# Patient Record
Sex: Male | Born: 1985 | Race: White | Hispanic: No | Marital: Single | Smoking: Never smoker
Health system: Southern US, Community
[De-identification: ages and names within clinical notes are randomized; demographics above are authoritative.]

## PROBLEM LIST (undated history)

## (undated) HISTORY — PX: FOOT SURGERY: SHX648

## (undated) HISTORY — PX: OTHER SURGICAL HISTORY: SHX169

## (undated) HISTORY — PX: SHOULDER SURGERY: SHX246

---

## 2008-03-04 ENCOUNTER — Emergency Department (HOSPITAL_COMMUNITY): Admission: EM | Admit: 2008-03-04 | Discharge: 2008-03-04 | Payer: Self-pay | Admitting: Emergency Medicine

## 2008-05-31 ENCOUNTER — Emergency Department (HOSPITAL_BASED_OUTPATIENT_CLINIC_OR_DEPARTMENT_OTHER): Admission: EM | Admit: 2008-05-31 | Discharge: 2008-05-31 | Payer: Self-pay | Admitting: Emergency Medicine

## 2008-05-31 ENCOUNTER — Ambulatory Visit: Payer: Self-pay | Admitting: Diagnostic Radiology

## 2008-06-05 ENCOUNTER — Emergency Department (HOSPITAL_BASED_OUTPATIENT_CLINIC_OR_DEPARTMENT_OTHER): Admission: EM | Admit: 2008-06-05 | Discharge: 2008-06-06 | Payer: Self-pay | Admitting: Emergency Medicine

## 2008-06-05 ENCOUNTER — Ambulatory Visit: Payer: Self-pay | Admitting: Diagnostic Radiology

## 2008-06-06 ENCOUNTER — Ambulatory Visit: Payer: Self-pay | Admitting: Diagnostic Radiology

## 2009-10-11 ENCOUNTER — Ambulatory Visit (HOSPITAL_BASED_OUTPATIENT_CLINIC_OR_DEPARTMENT_OTHER): Admission: RE | Admit: 2009-10-11 | Discharge: 2009-10-11 | Payer: Self-pay | Admitting: Unknown Physician Specialty

## 2010-09-03 LAB — POCT HEMOGLOBIN-HEMACUE: Hemoglobin: 14.4 g/dL (ref 13.0–17.0)

## 2011-03-17 LAB — POCT I-STAT, CHEM 8
Calcium, Ion: <0.25 — ABNORMAL LOW
Creatinine, Ser: 0.9
Glucose, Bld: 91
HCT: 31 — ABNORMAL LOW
Hemoglobin: 10.5 — ABNORMAL LOW

## 2013-03-23 ENCOUNTER — Encounter: Payer: Self-pay | Admitting: Podiatry

## 2013-04-06 ENCOUNTER — Ambulatory Visit: Payer: BC Managed Care – PPO | Admitting: Podiatry

## 2013-05-04 ENCOUNTER — Ambulatory Visit: Payer: BC Managed Care – PPO | Admitting: Podiatry

## 2013-07-21 ENCOUNTER — Ambulatory Visit: Payer: BC Managed Care – PPO | Admitting: Podiatry

## 2013-07-28 ENCOUNTER — Ambulatory Visit: Payer: BC Managed Care – PPO | Admitting: Podiatry

## 2013-08-11 ENCOUNTER — Ambulatory Visit: Payer: BC Managed Care – PPO | Admitting: Podiatry

## 2013-08-17 ENCOUNTER — Ambulatory Visit (INDEPENDENT_AMBULATORY_CARE_PROVIDER_SITE_OTHER): Payer: BC Managed Care – PPO

## 2013-08-17 ENCOUNTER — Encounter: Payer: Self-pay | Admitting: Podiatry

## 2013-08-17 ENCOUNTER — Ambulatory Visit (INDEPENDENT_AMBULATORY_CARE_PROVIDER_SITE_OTHER): Payer: BC Managed Care – PPO | Admitting: Podiatry

## 2013-08-17 VITALS — BP 154/72 | HR 62 | Resp 12

## 2013-08-17 DIAGNOSIS — R52 Pain, unspecified: Secondary | ICD-10-CM

## 2013-08-17 DIAGNOSIS — M7750 Other enthesopathy of unspecified foot: Secondary | ICD-10-CM

## 2013-08-17 MED ORDER — TRAMADOL HCL 50 MG PO TABS: 50.0000 mg | ORAL_TABLET | Freq: Three times a day (TID) | ORAL | Status: DC

## 2013-08-17 MED ORDER — TRIAMCINOLONE ACETONIDE 10 MG/ML IJ SUSP
10.0000 mg | Freq: Once | INTRAMUSCULAR | Status: AC
  Administered 2013-08-17: 10 mg

## 2013-08-18 NOTE — Progress Notes (Signed)
Subjective:     Patient ID: Robert Valenzuela, male   DOB: 07/25/1985, 28 y.o.   MRN: [id number]  HPI patient states that he is still getting some discomfort around the surgery site on his right foot. 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 Did careful injection more plantar 3 mg Kenalog 5 mm Xylocaine Marcaine mixture and I advised him on wearing his boot and his orthotics. He will also be placed on tramadol 50 mg 3 times a day and will be seen back in 4 weeks

## 2013-09-07 ENCOUNTER — Ambulatory Visit (INDEPENDENT_AMBULATORY_CARE_PROVIDER_SITE_OTHER): Payer: BC Managed Care – PPO | Admitting: Podiatry

## 2013-09-07 ENCOUNTER — Encounter: Payer: Self-pay | Admitting: Podiatry

## 2013-09-07 VITALS — BP 149/79 | HR 57 | Resp 16

## 2013-09-07 DIAGNOSIS — M7750 Other enthesopathy of unspecified foot: Secondary | ICD-10-CM

## 2013-09-07 NOTE — Progress Notes (Signed)
Subjective:     Patient ID: Robert Valenzuela, male   DOB: 06/18/1985, 28 y.o.   MRN: [id number]  HPI patient states that he is improved with the tramadol but he continues to feel lack of support right foot and minimal and moderate discomfort if he's on it too long   Review of Systems     Objective:   Physical Exam Neurovascular status was intact with no health history changes noted and mild discomfort posterior tibial insertion right with incision site healed well from previous surgery    Assessment:     Continue tendinitis right posterior tib insertion and slightly plantar    Plan:     New orthotics were scanned and a new fascially brace dispensed the patient. Reappoint in 2 weeks

## 2013-09-22 ENCOUNTER — Ambulatory Visit: Payer: BC Managed Care – PPO | Admitting: Podiatry

## 2013-10-06 ENCOUNTER — Encounter: Payer: Self-pay | Admitting: Podiatry

## 2016-02-13 ENCOUNTER — Other Ambulatory Visit: Payer: Self-pay | Admitting: Otolaryngology

## 2016-02-13 DIAGNOSIS — H9121 Sudden idiopathic hearing loss, right ear: Secondary | ICD-10-CM

## 2016-02-24 ENCOUNTER — Ambulatory Visit
Admission: RE | Admit: 2016-02-24 | Discharge: 2016-02-24 | Disposition: A | Discharge status: Home / Self Care | Payer: 59 | Source: Ambulatory Visit | Attending: Otolaryngology | Admitting: Otolaryngology

## 2016-02-24 DIAGNOSIS — H9121 Sudden idiopathic hearing loss, right ear: Secondary | ICD-10-CM

## 2016-02-24 MED ORDER — GADOBENATE DIMEGLUMINE 529 MG/ML IV SOLN
18.0000 mL | Freq: Once | INTRAVENOUS | Status: AC | PRN
  Administered 2016-02-24: 18 mL via INTRAVENOUS

## 2018-07-24 ENCOUNTER — Emergency Department
Admission: EM | Admit: 2018-07-24 | Discharge: 2018-07-24 | Disposition: A | Discharge status: Home / Self Care | Payer: Self-pay | Attending: Emergency Medicine | Admitting: Emergency Medicine

## 2018-07-24 ENCOUNTER — Other Ambulatory Visit: Payer: Self-pay

## 2018-07-24 ENCOUNTER — Encounter: Payer: Self-pay | Admitting: Emergency Medicine

## 2018-07-24 ENCOUNTER — Emergency Department: Payer: Self-pay

## 2018-07-24 DIAGNOSIS — Y929 Unspecified place or not applicable: Secondary | ICD-10-CM | POA: Insufficient documentation

## 2018-07-24 DIAGNOSIS — S43005A Unspecified dislocation of left shoulder joint, initial encounter: Secondary | ICD-10-CM

## 2018-07-24 DIAGNOSIS — S43015A Anterior dislocation of left humerus, initial encounter: Secondary | ICD-10-CM | POA: Insufficient documentation

## 2018-07-24 DIAGNOSIS — Y9389 Activity, other specified: Secondary | ICD-10-CM | POA: Insufficient documentation

## 2018-07-24 DIAGNOSIS — Y998 Other external cause status: Secondary | ICD-10-CM | POA: Insufficient documentation

## 2018-07-24 DIAGNOSIS — X58XXXA Exposure to other specified factors, initial encounter: Secondary | ICD-10-CM | POA: Insufficient documentation

## 2018-07-24 MED ORDER — KETOROLAC TROMETHAMINE 30 MG/ML IJ SOLN
30.0000 mg | Freq: Once | INTRAMUSCULAR | Status: AC
  Administered 2018-07-24: 30 mg via INTRAVENOUS
  Filled 2018-07-24: qty 1

## 2018-07-24 MED ORDER — HYDROMORPHONE HCL 1 MG/ML IJ SOLN
1.0000 mg | Freq: Once | INTRAMUSCULAR | Status: AC
  Administered 2018-07-24: 1 mg via INTRAVENOUS
  Filled 2018-07-24: qty 1

## 2018-07-24 MED ORDER — IBUPROFEN 800 MG PO TABS: 800.0000 mg | ORAL_TABLET | Freq: Three times a day (TID) | ORAL | 0 refills | Status: AC | PRN

## 2018-07-24 MED ORDER — DIAZEPAM 5 MG PO TABS: 5.0000 mg | ORAL_TABLET | Freq: Three times a day (TID) | ORAL | 0 refills | Status: AC | PRN

## 2018-07-24 MED ORDER — DIAZEPAM 5 MG PO TABS
10.0000 mg | ORAL_TABLET | Freq: Once | ORAL | Status: AC
  Administered 2018-07-24: 10 mg via ORAL
  Filled 2018-07-24: qty 2

## 2018-07-24 NOTE — ED Notes (Addendum)
Attempted iv x2 - unable to establish. Iv team consult placed. Pt states "this happens all time" in regards to his left should pain. Pt does not have ortho pcp

## 2018-07-24 NOTE — ED Triage Notes (Signed)
L shoulder pain, feels like dislocated it 45 min ago. History of same. Unable to get it back in himself.

## 2018-07-24 NOTE — ED Provider Notes (Signed)
[facility] Emergency Department Provider Note       Time seen: ----------------------------------------- 2:17 PM on 07/24/2018 -----------------------------------------   I have reviewed the triage vital signs and the nursing notes.  HISTORY   Chief Complaint Shoulder Pain    HPI Erion Gaunce is a 33 y.o. male with a history of shoulder dislocations who presents to the ED for left shoulder pain he feels like he dislocated it 45 minutes ago with a history of same.  He was unable to reduce it himself.  Patient complains of severe pain in the left shoulder.  History reviewed. No pertinent past medical history.  There are no active problems to display for this patient.   History reviewed. No pertinent surgical history.  Allergies Patient has no known allergies.  Social History Social History   Tobacco Use  . Smoking status: Never Smoker  Substance Use Topics  . Alcohol use: No  . Drug use: No   Review of Systems Constitutional: Negative for fever. Cardiovascular: Negative for chest pain. Respiratory: Negative for shortness of breath. Musculoskeletal: Positive for left shoulder pain Neurological: Negative for headaches, focal weakness or numbness.  All systems negative/normal/unremarkable except as stated in the HPI  ____________________________________________   PHYSICAL EXAM:  VITAL SIGNS: ED Triage Vitals  Enc Vitals Group     BP 07/24/18 1412 140/89     Pulse Rate 07/24/18 1409 88     Resp 07/24/18 1409 20     Temp 07/24/18 1409 98 F (36.7 C)     Temp Source 07/24/18 1409 Oral     SpO2 07/24/18 1409 100 %     Weight 07/24/18 1410 185 lb (83.9 kg)     Height 07/24/18 1410 5\' 7"  (1.702 m)     Head Circumference --      Peak Flow --      Pain Score 07/24/18 1410 10     Pain Loc --      Pain Edu? --      Excl. in GC? --    Constitutional: Alert and oriented.  Mild to moderate distress from pain Cardiovascular: Normal  rate, regular rhythm. No murmurs, rubs, or gallops. Respiratory: Normal respiratory effort without tachypnea nor retractions. Breath sounds are clear and equal bilaterally. No wheezes/rales/rhonchi. Musculoskeletal: Left upper extremity is deformed at the shoulder with external rotation Neurologic:  Normal speech and language. No gross focal neurologic deficits are appreciated.  Skin:  Skin is warm, dry and intact. No rash noted.  ____________________________________________  ED COURSE:  As part of my medical decision making, I reviewed the following data within the electronic MEDICAL RECORD NUMBER History obtained from family if available, nursing notes, old chart and ekg, as well as notes from prior ED visits. Patient presented for left shoulder dislocation, I will attempt to manually reduce.  Patient will receive IV Dilaudid and oral Valium.   Gaylord Shih Injury Treatment Date/Time: 07/24/2018 3:54 PM Performed by: Emily Filbert, MD Authorized by: Emily Filbert, MD   Consent:    Consent given by:  PatientInjury location: shoulder Injury type: dislocation Dislocation type: anterior Hill-Sachs deformity: no Chronicity: recurrent Pre-procedure distal perfusion: normal Pre-procedure neurological function: normal Pre-procedure range of motion: reduced  Anesthesia: Local anesthesia used: no  Patient sedated: NoManipulation performed: yes Reduction method: external rotation Reduction successful: yes X-ray confirmed reduction: yes Immobilization: sling Post-procedure distal perfusion: normal Post-procedure neurological function: normal Post-procedure range of motion: normal    RADIOLOGY Images were viewed by me  Left  shoulder x-rays reveal successful reduction  ____________________________________________   DIFFERENTIAL DIAGNOSIS   Shoulder dislocation, fracture  FINAL ASSESSMENT AND PLAN  Left shoulder dislocation   Plan: The patient had presented for left  shoulder dislocation.  Left shoulder was reduced using external rotation with the patient in supine position.  He had been given Dilaudid and Valium prior to this reduction.  He was placed in a sling after normal x-rays.  He is cleared for outpatient follow-up.   Ulice Dash, MD    Note: This note was generated in part or whole with voice recognition software. Voice recognition is usually quite accurate but there are transcription errors that can and very often do occur. I apologize for any typographical errors that were not detected and corrected.     Emily Filbert, MD 07/24/18 1556

## 2019-12-18 IMAGING — DX DG SHOULDER 2+V*L*
2 series · 2 of 2 positions shown · non-contrast
Comparison: 09/05/2017

CLINICAL DATA: Post LEFT shoulder reduction

EXAM:
LEFT SHOULDER - 2+ VIEW

[shoulder ap]
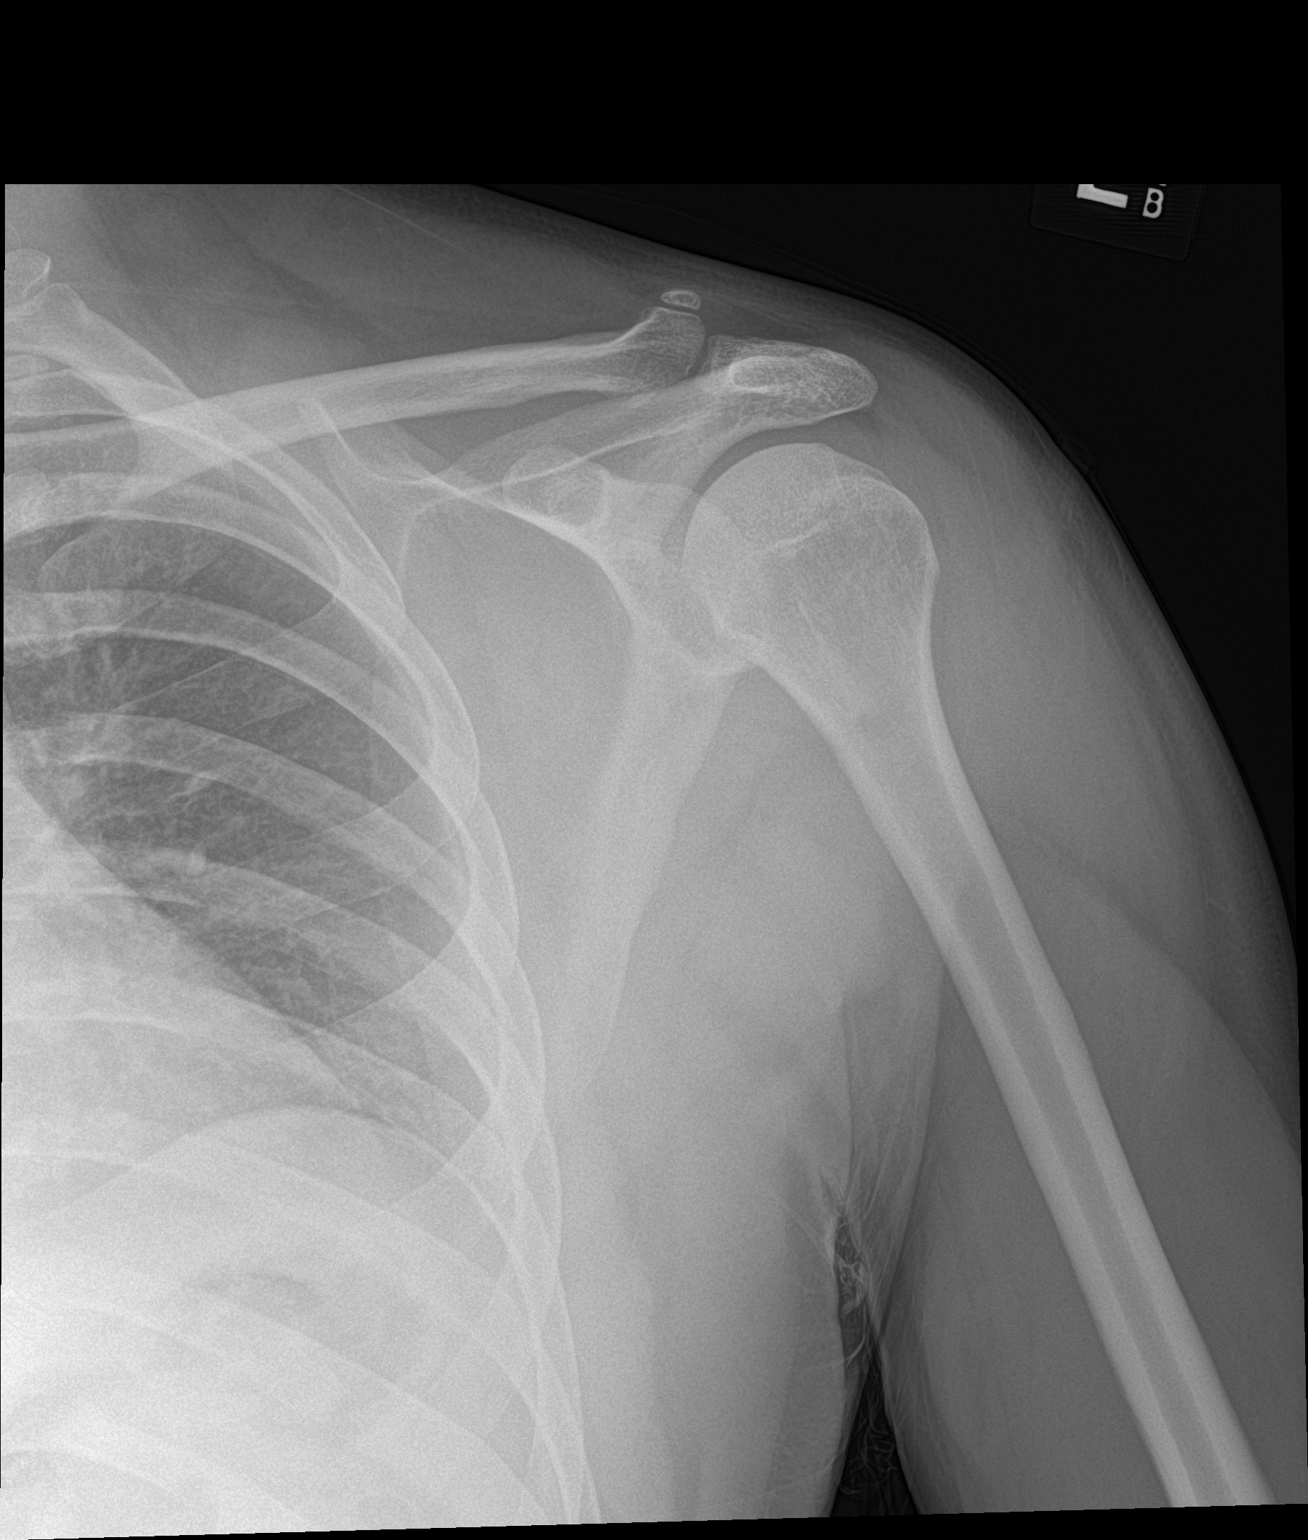

[shoulder obl]
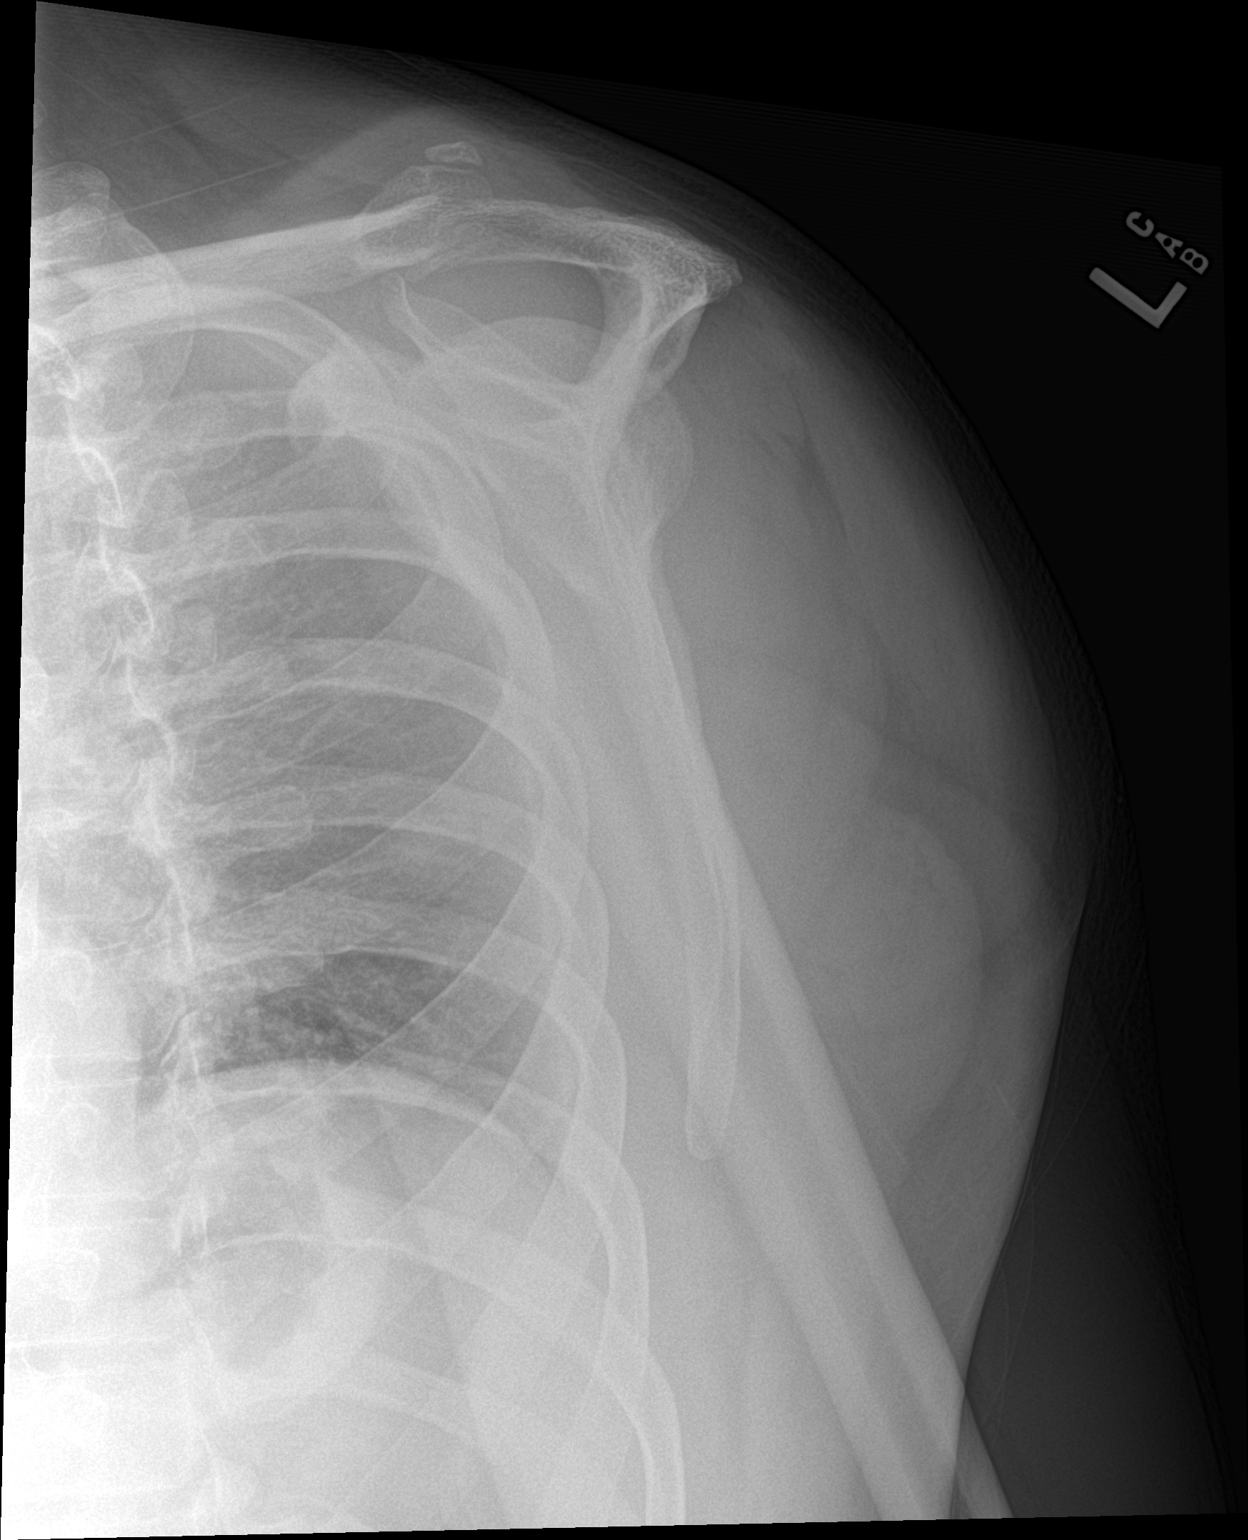

[2 of 2 positions shown; findings below may reference images not displayed]

FINDINGS: Osseous mineralization normal.

AC joint alignment normal with a corticated old ossicle adjacent to
distal clavicle.

No acute fracture, dislocation, or bone destruction.

Visualized LEFT ribs intact.
IMPRESSION: No acute abnormalities.

## 2022-08-10 ENCOUNTER — Emergency Department (HOSPITAL_BASED_OUTPATIENT_CLINIC_OR_DEPARTMENT_OTHER): Payer: Commercial Managed Care - PPO

## 2022-08-10 ENCOUNTER — Other Ambulatory Visit: Payer: Self-pay

## 2022-08-10 ENCOUNTER — Emergency Department (HOSPITAL_BASED_OUTPATIENT_CLINIC_OR_DEPARTMENT_OTHER)
Admission: EM | Admit: 2022-08-10 | Discharge: 2022-08-10 | Disposition: A | Discharge status: Home / Self Care | Payer: Commercial Managed Care - PPO | Attending: Emergency Medicine | Admitting: Emergency Medicine

## 2022-08-10 ENCOUNTER — Encounter (HOSPITAL_BASED_OUTPATIENT_CLINIC_OR_DEPARTMENT_OTHER): Payer: Self-pay | Admitting: Emergency Medicine

## 2022-08-10 DIAGNOSIS — Y92410 Unspecified street and highway as the place of occurrence of the external cause: Secondary | ICD-10-CM | POA: Insufficient documentation

## 2022-08-10 DIAGNOSIS — S20219A Contusion of unspecified front wall of thorax, initial encounter: Secondary | ICD-10-CM | POA: Diagnosis not present

## 2022-08-10 DIAGNOSIS — M542 Cervicalgia: Secondary | ICD-10-CM | POA: Insufficient documentation

## 2022-08-10 DIAGNOSIS — S299XXA Unspecified injury of thorax, initial encounter: Secondary | ICD-10-CM | POA: Diagnosis present

## 2022-08-10 DIAGNOSIS — S0990XA Unspecified injury of head, initial encounter: Secondary | ICD-10-CM | POA: Diagnosis not present

## 2022-08-10 LAB — BASIC METABOLIC PANEL
Anion gap: 9 (ref 5–15)
BUN: 14 mg/dL (ref 6–20)
CO2: 25 mmol/L (ref 22–32)
Calcium: 9.4 mg/dL (ref 8.9–10.3)
Chloride: 102 mmol/L (ref 98–111)
Creatinine, Ser: 0.89 mg/dL (ref 0.61–1.24)
GFR, Estimated: >60 mL/min (ref >60)
Glucose, Bld: 97 mg/dL (ref 70–99)
Potassium: 3.2 mmol/L — ABNORMAL LOW (ref 3.5–5.1)
Sodium: 136 mmol/L (ref 135–145)

## 2022-08-10 MED ORDER — POTASSIUM CHLORIDE CRYS ER 20 MEQ PO TBCR
40.0000 meq | EXTENDED_RELEASE_TABLET | Freq: Once | ORAL | Status: AC
  Administered 2022-08-10: 40 meq via ORAL
  Filled 2022-08-10: qty 2

## 2022-08-10 MED ORDER — IOHEXOL 300 MG/ML  SOLN
80.0000 mL | Freq: Once | INTRAMUSCULAR | Status: AC | PRN
  Administered 2022-08-10: 75 mL via INTRAVENOUS
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
